# Patient Record
Sex: Male | Born: 2000 | Race: Black or African American | Hispanic: No | Marital: Single
Health system: Southern US, Community
[De-identification: ages and names within clinical notes are randomized; demographics above are authoritative.]

---

## 2020-09-18 ENCOUNTER — Other Ambulatory Visit: Payer: Self-pay

## 2020-09-18 ENCOUNTER — Emergency Department (HOSPITAL_COMMUNITY): Payer: Medicaid - Out of State

## 2020-09-18 ENCOUNTER — Emergency Department (HOSPITAL_COMMUNITY)
Admission: EM | Admit: 2020-09-18 | Discharge: 2020-09-18 | Disposition: A | Discharge status: Home / Self Care | Payer: Medicaid - Out of State | Attending: Emergency Medicine | Admitting: Emergency Medicine

## 2020-09-18 DIAGNOSIS — S6991XA Unspecified injury of right wrist, hand and finger(s), initial encounter: Secondary | ICD-10-CM | POA: Diagnosis present

## 2020-09-18 DIAGNOSIS — S60221A Contusion of right hand, initial encounter: Secondary | ICD-10-CM | POA: Insufficient documentation

## 2020-09-18 DIAGNOSIS — W25XXXA Contact with sharp glass, initial encounter: Secondary | ICD-10-CM | POA: Diagnosis not present

## 2020-09-18 NOTE — Discharge Instructions (Addendum)
X-rays did not reveal fracture dislocation  Take ibuprofen or acetaminophen every 6-8 hours for pain.  Ice.  Start doing light range of motion exercises with your finger/knuckles.  Pain and swelling should improve over the course of 7 to 10 days.  Follow-up with your primary care doctor in 1 to 2 weeks if pain and range of motion has not gone back to normal
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
## 2020-09-18 NOTE — ED Notes (Signed)
Unable to sign MSE as writing hand is injured.
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
## 2022-08-30 ENCOUNTER — Emergency Department (HOSPITAL_COMMUNITY)
Admission: EM | Admit: 2022-08-30 | Discharge: 2022-08-30 | Disposition: A | Discharge status: Home / Self Care | Payer: Medicaid - Out of State | Attending: Emergency Medicine | Admitting: Emergency Medicine

## 2022-08-30 ENCOUNTER — Encounter (HOSPITAL_COMMUNITY): Payer: Self-pay | Admitting: Registered Nurse

## 2022-08-30 DIAGNOSIS — F332 Major depressive disorder, recurrent severe without psychotic features: Secondary | ICD-10-CM | POA: Insufficient documentation

## 2022-08-30 DIAGNOSIS — R45851 Suicidal ideations: Secondary | ICD-10-CM | POA: Insufficient documentation

## 2022-08-30 DIAGNOSIS — X780XXA Intentional self-harm by sharp glass, initial encounter: Secondary | ICD-10-CM | POA: Insufficient documentation

## 2022-08-30 DIAGNOSIS — S0990XA Unspecified injury of head, initial encounter: Secondary | ICD-10-CM | POA: Diagnosis present

## 2022-08-30 DIAGNOSIS — S01112A Laceration without foreign body of left eyelid and periocular area, initial encounter: Secondary | ICD-10-CM | POA: Insufficient documentation

## 2022-08-30 DIAGNOSIS — F419 Anxiety disorder, unspecified: Secondary | ICD-10-CM | POA: Insufficient documentation

## 2022-08-30 DIAGNOSIS — F1010 Alcohol abuse, uncomplicated: Secondary | ICD-10-CM | POA: Diagnosis not present

## 2022-08-30 DIAGNOSIS — Y906 Blood alcohol level of 120-199 mg/100 ml: Secondary | ICD-10-CM | POA: Diagnosis not present

## 2022-08-30 DIAGNOSIS — Z7689 Persons encountering health services in other specified circumstances: Secondary | ICD-10-CM

## 2022-08-30 LAB — COMPREHENSIVE METABOLIC PANEL
ALT: 22 U/L (ref 0–44)
AST: 24 U/L (ref 15–41)
Albumin: 4.6 g/dL (ref 3.5–5.0)
Alkaline Phosphatase: 44 U/L (ref 38–126)
Anion gap: 12 (ref 5–15)
BUN: 12 mg/dL (ref 6–20)
CO2: 24 mmol/L (ref 22–32)
Calcium: 8.7 mg/dL — ABNORMAL LOW (ref 8.9–10.3)
Chloride: 105 mmol/L (ref 98–111)
Creatinine, Ser: 0.95 mg/dL (ref 0.61–1.24)
GFR, Estimated: >60 mL/min (ref >60)
Glucose, Bld: 88 mg/dL (ref 70–99)
Potassium: 3.6 mmol/L (ref 3.5–5.1)
Sodium: 141 mmol/L (ref 135–145)
Total Bilirubin: 0.8 mg/dL (ref 0.3–1.2)
Total Protein: 7.4 g/dL (ref 6.5–8.1)

## 2022-08-30 LAB — CBC
HCT: 47.1 % (ref 39.0–52.0)
Hemoglobin: 15.1 g/dL (ref 13.0–17.0)
MCH: 28.9 pg (ref 26.0–34.0)
MCHC: 32.1 g/dL (ref 30.0–36.0)
MCV: 90.2 fL (ref 80.0–100.0)
Platelets: 219 10*3/uL (ref 150–400)
RBC: 5.22 MIL/uL (ref 4.22–5.81)
RDW: 13.6 % (ref 11.5–15.5)
WBC: 8.1 10*3/uL (ref 4.0–10.5)
nRBC: 0 % (ref 0.0–0.2)

## 2022-08-30 LAB — ETHANOL: Alcohol, Ethyl (B): 180 mg/dL — ABNORMAL HIGH (ref <10)

## 2022-08-30 LAB — ACETAMINOPHEN LEVEL: Acetaminophen (Tylenol), Serum: <10 ug/mL — ABNORMAL LOW (ref 10–30)

## 2022-08-30 LAB — SALICYLATE LEVEL: Salicylate Lvl: <7 mg/dL — ABNORMAL LOW (ref 7.0–30.0)
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
## 2022-08-30 NOTE — ED Notes (Signed)
Patient refusing all care at this time saying he doesn't want the bill after being forced to come here

## 2022-08-30 NOTE — ED Notes (Signed)
Pt has 1 belongings bag places in cabinets behind 9-12 nurses station

## 2022-08-30 NOTE — ED Provider Notes (Cosign Needed Addendum)
  At this time patient is clinically sober, has been reassessed by myself.  Some dried blood noted on vertex of scalp but no deep laceration requiring surgical repair.  Patient is resting comfortably.  Will allow TTS for further management of his psychiatric illness.  2:58 PM Psychiatry specialist has seen evaluate patient and at this time he is psychiatrically cleared.  IVC paper will be rescind by attending provider.   BP (!) 100/50 (BP Location: Right Arm)   Pulse 62   Temp 97.7 F (36.5 C) (Oral)   Resp 16   SpO2 99%   Results for orders placed or performed during the hospital encounter of 08/30/22  CBC  Result Value Ref Range   WBC 8.1 4.0 - 10.5 K/uL   RBC 5.22 4.22 - 5.81 MIL/uL   Hemoglobin 15.1 13.0 - 17.0 g/dL   HCT 47.1 39.0 - 52.0 %   MCV 90.2 80.0 - 100.0 fL   MCH 28.9 26.0 - 34.0 pg   MCHC 32.1 30.0 - 36.0 g/dL   RDW 13.6 11.5 - 15.5 %   Platelets 219 150 - 400 K/uL   nRBC 0.0 0.0 - 0.2 %  Comprehensive metabolic panel  Result Value Ref Range   Sodium 141 135 - 145 mmol/L   Potassium 3.6 3.5 - 5.1 mmol/L   Chloride 105 98 - 111 mmol/L   CO2 24 22 - 32 mmol/L   Glucose, Bld 88 70 - 99 mg/dL   BUN 12 6 - 20 mg/dL   Creatinine, Ser 0.95 0.61 - 1.24 mg/dL   Calcium 8.7 (L) 8.9 - 10.3 mg/dL   Total Protein 7.4 6.5 - 8.1 g/dL   Albumin 4.6 3.5 - 5.0 g/dL   AST 24 15 - 41 U/L   ALT 22 0 - 44 U/L   Alkaline Phosphatase 44 38 - 126 U/L   Total Bilirubin 0.8 0.3 - 1.2 mg/dL   GFR, Estimated >60 >60 mL/min   Anion gap 12 5 - 15  Ethanol  Result Value Ref Range   Alcohol, Ethyl (B) 180 (H) Q000111Q mg/dL  Salicylate level  Result Value Ref Range   Salicylate Lvl Q000111Q (L) 7.0 - 30.0 mg/dL  Acetaminophen level  Result Value Ref Range   Acetaminophen (Tylenol), Serum <10 (L) 10 - 30 ug/mL   No results found.    Domenic Moras, PA-C 08/30/22 1432    Domenic Moras, PA-C 08/30/22 1531    Cristie Hem, MD 08/31/22 1357

## 2022-08-30 NOTE — ED Notes (Signed)
Pt wheeled to [facility] at this time. Asleep in his chair. Pt agreeable to standing and letting security wand him. Pt placed in bed and given covers. Pt requesting to sleep at this time. States he had tequila shots and beer last night. Currently denying SI/HI, no AVH.

## 2022-08-30 NOTE — ED Notes (Signed)
Patient to room 37. Patient ambulated to room Patient drowsy but alert. Patient cooperative. Patient oriented to unit and room Patient denied suicidal ideation at this time.

## 2022-08-30 NOTE — ED Notes (Signed)
Patient discharged off unit per provider. Patient alert and cooperative. discharge information given to patient with acknowledged understanding. Belongings given to patient,. Patient ambulatory off unit, escorted byRN. Patient transported by family
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
## 2022-08-30 NOTE — Discharge Instructions (Signed)
Same-day appointments Operating hours and clinician availability may delay appointments until the next business day. [facility] and Current Patients  Psychiatry hours Monday-Friday, 8am-4:30pm (Eastern Time) If you are experiencing problems accessing [facility] send email to: [email or web address] or call [phone], Monday-Friday, 8:00am-4:30pm If you are having a psychiatric or medical emergency, please call 911 or go to the nearest emergency department. To reach the Suicide and Crisis Lifeline, please call or text [phone].  What is [facility]?  [facility] is an online service that provides same-day access to psychiatry to meet urgent mental health needs. The goal is to provide patients with timely intervention and keep them in an outpatient setting.  How long will I wait to see a clinician?  Our goal is to provide same-day care. However, operating hours and clinician availability may delay appointments until the next business day.  What if I can't get an appointment?  Please call [facility] at [phone] 8am to [place] Time or email [place]:  [email or web address]  to request the next available time. If you are having a psychiatric or medical emergency, please call 911 or go to the nearest emergency department. To reach the Suicide and Crisis Lifeline, please call or text [phone]. Do you accept insurance?  Yes. We accept most commercial insurance plans. What information do you need from me? New patients should be ready to provide:  Photo ID  Insurance card  Payment information  For current patients, our specialists will confirm your documentation is on file.   Can I continue with regular care after my [facility] session?  Yes. Our goal is to make sure you have the follow-up care you need. Our specialist can help you schedule an appointment with an ongoing provider in addition to your On Demand session.  Can my current clinician provide  treatment on [place]?  No. Our [facility] clinicians are trained to assist with more immediate needs and provide support between regular appointments with your clinician.  What device can I use to connect with [facility]?  You can use any Wi-Fi-enabled device with a camera and a microphone, such as a smartphone, tablet, or computer.  Do I have to be at home to connect with [place] [facility]?  No. As long as you are located within [place] or [place] you can connect with a provider. We do request that you connect from a safe and private location. Your clinician is required to document your location for emergency purposes.         M.E.N.D. meets ONLINE via ZOOM from 12pm-1pm on:  REGISTRATION REQUIRED:  [email or web address] February 7th  March 6th May 1st June 5th August 7th September 4th November 6th  MEND: Men's Building surveyor for [employer] to [employer], the [employer] for [employer], a Midwife (RGN) initiative from the [employer].  MEND is a community where men come together to support, learn, and grow, emphasizing key aspects of personal and community development.  MEND's Mission: MEND is committed to fostering resilience, empowerment, awareness, and community health among men. Through open dialogue and shared experiences, we strive to create a supportive space where men can navigate challenges, enhance their well-being, and contribute positively to the community.  Join [place]: MEND invites men from all walks of life to join our community. Whether you're a father, a professional, or someone navigating life's complexities, MEND is here for you. Together, we can strengthen the fabric  of our community and empower each other to lead fulfilling lives. Explore the MEND community and take the first step towards a journey of personal growth and community impact. Topics of  Conversation Fatherhood: Explore the joys and challenges of fatherhood, sharing insights and experiences. Mental Health: Address the importance of mental health, reduce stigma, and provide support. Substance Use: Foster understanding and promote dialogue around substance use and its impact. Healthcare: Discuss health-related topics, preventive care, and overall well-being. Navigating Community Systems: Share strategies for navigating community resources and systems effectively.  What We Offer: Regular Meetings: Engage in meaningful discussions on fatherhood, mental health, substance use, healthcare, and navigating community systems. Supportive Environment: Find a welcoming space where your voice is heard, and your journey is valued.

## 2022-08-30 NOTE — BH Assessment (Signed)
Per RN, Pt is currently too somnolent to participate in tele-assessment.   Evin Chirco Ellis Amdrew Oboyle Jr, LCMHC, NCC Triage Specialist [phone]  

## 2022-08-30 NOTE — Consult Note (Signed)
[facility] ED ASSESSMENT   Reason for Consult:  suicidal ideation Referring Physician:  Azucena Cecil, PA-C  Patient Identification: Andrew Prince MRN:  [id number] ED Chief Complaint: MDD (major depressive disorder), recurrent severe, without psychosis ([place])  Diagnosis:  Principal Problem:   MDD (major depressive disorder), recurrent severe, without psychosis ([place]) Active Problems:   Suicidal ideation   ED Assessment Time Calculation: Start Time: 1030 Stop Time: 1100 Total Time in Minutes (Assessment Completion): 30   Subjective:   Andrew Prince is a 22 y.o. male patient admitted to [facility] ED after presenting via EMS with complaints of intoxication, hitting himself in the head with a lamp, and suicidal ideation  HPI:  Andrew Prince 22 y.o., male patient seen face to face by this provider, consulted with Dr. Hampton Abbot; and chart reviewed on 08/30/22.  On evaluation Andrew Prince reports Andrew Prince was intoxicated but Andrew Prince did hit himself in the head.  Andrew Prince states that it was not a suicide attempt and denies suicidal ideation.  Andrew Prince reports a history of depression and anxiety.  States Andrew Prince has outpatient psychiatric services for medication management and counseling.  Stopped Lexapro about a month ago and psychiatrist is aware.  Stating that Andrew Prince doesn't want to be restarted on antidepressant that Andrew Prince is currently taking medication for anxiety and working well.  States that Andrew Prince would like a new therapist related to difficulty contacting current therapist.  States Andrew Prince is supposed to have weekly sessions but only getting monthly.  Patient denies suicidal/self-harm/homicidal ideation, psychosis, and paranoia.  No prior suicide attempt of self harm.  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  Gave permission to speak to girlfriend for collateral information. During evaluation Andrew Prince is lying in bed with no noted distress.  Andrew Prince is alert/oriented x 4, calm, cooperative, and attentive.  His  responses were appropriate to assessment questions.  His mood is anxious with congruent affect.  Andrew Prince spoke in a clear tone at moderate volume, and normal pace, with good eye contact.   Andrew Prince denies suicidal/self-harm/homicidal ideation, psychosis, and paranoia.  Objectively:  there is no evidence of psychosis/mania or delusional thinking.  Andrew Prince conversed coherently, with goal directed thoughts, and no distractibility, or pre-occupation and Andrew Prince has denied suicidal/self-harm/homicidal ideation, psychosis, and paranoia.  Collateral Information gathered from girlfriend who states that she doesn't feel that patient is a danger to himself or other.  Did suggest patient needed another therapist.  Resources added to discharge instructions.      At this time Andrew Prince is educated and verbalizes understanding of mental health resources and other crisis services in the community. Andrew Prince is instructed to call 911 and present to the nearest emergency room should Andrew Prince experience any suicidal/homicidal ideation, auditory/visual/hallucinations, or detrimental worsening of his mental health condition.   Andrew Prince was a also advised by Probation officer that Andrew Prince could call the toll-free phone on back of  insurance card to assist with identifying in network services and agencies.    Past Psychiatric History: Anxiety, depression, alcohol use mild  Risk to Self or Others: Is the patient at risk to self? No Has the patient been a risk to self in the past 6 months? No Has the patient been a risk to self within the distant past? No Is the patient a risk to others? No Has the patient been a risk to others in the past 6 months? No Has the patient been a risk to others within the distant past? No  [place] Scale:  [facility] from 08/30/2022 in [facility] Emergency Department at [facility] ED from 09/18/2020 in [facility] Emergency Department at [place] No Risk No Risk       AIMS:  , , ,  ,    ASAM:    Substance Abuse:     Past Medical History: History reviewed. No pertinent past medical history. History reviewed. No pertinent surgical history. Family History: History reviewed. No pertinent family history. Family Psychiatric  History: History reviewed. No pertinent family history.   None reported Social History:  Social History   Substance and Sexual Activity  Alcohol Use None     Social History   Substance and Sexual Activity  Drug Use Not on file    Social History   Socioeconomic History   Marital status: Single    Spouse name: Not on file   Number of children: Not on file   Years of education: Not on file   Highest education level: Not on file  Occupational History   Not on file  Tobacco Use   Smoking status: Not on file   Smokeless tobacco: Not on file  Substance and Sexual Activity   Alcohol use: Not on file   Drug use: Not on file   Sexual activity: Not on file  Other Topics Concern   Not on file  Social History Narrative   Not on file   Social Determinants of Health   Financial Resource Strain: Not on file  Food Insecurity: Not on file  Transportation Needs: Not on file  Physical Activity: Not on file  Stress: Not on file  Social Connections: Not on file   Additional Social History:    Allergies:  No Known Allergies  Labs:  Results for orders placed or performed during the hospital encounter of 08/30/22 (from the past 48 hour(s))  CBC     Status: None   Collection Time: 08/30/22  3:41 AM  Result Value Ref Range   WBC 8.1 4.0 - 10.5 K/uL   RBC 5.22 4.22 - 5.81 MIL/uL   Hemoglobin 15.1 13.0 - 17.0 g/dL   HCT 47.1 39.0 - 52.0 %   MCV 90.2 80.0 - 100.0 fL   MCH 28.9 26.0 - 34.0 pg   MCHC 32.1 30.0 - 36.0 g/dL   RDW 13.6 11.5 - 15.5 %   Platelets 219 150 - 400 K/uL   nRBC 0.0 0.0 - 0.2 %    Comment: Performed at [facility], [place] [address], [place], [place] [address]  Comprehensive metabolic panel     Status:  Abnormal   Collection Time: 08/30/22  3:41 AM  Result Value Ref Range   Sodium 141 135 - 145 mmol/L   Potassium 3.6 3.5 - 5.1 mmol/L   Chloride 105 98 - 111 mmol/L   CO2 24 22 - 32 mmol/L   Glucose, Bld 88 70 - 99 mg/dL    Comment: Glucose reference range applies only to samples taken after fasting for at least 8 hours.   BUN 12 6 - 20 mg/dL   Creatinine, Ser 0.95 0.61 - 1.24 mg/dL   Calcium 8.7 (L) 8.9 - 10.3 mg/dL   Total Protein 7.4 6.5 - 8.1 g/dL   Albumin 4.6 3.5 - 5.0 g/dL   AST 24 15 - 41 U/L   ALT 22 0 - 44 U/L   Alkaline Phosphatase 44 38 - 126 U/L   Total Bilirubin 0.8  0.3 - 1.2 mg/dL   GFR, Estimated >60 >60 mL/min    Comment: (NOTE) Calculated using the CKD-EPI Creatinine Equation (2021)    Anion gap 12 5 - 15    Comment: Performed at [facility], [place] [address], [place], [place] [address]  Ethanol     Status: Abnormal   Collection Time: 08/30/22  3:41 AM  Result Value Ref Range   Alcohol, Ethyl (B) 180 (H) <10 mg/dL    Comment: (NOTE) Lowest detectable limit for serum alcohol is 10 mg/dL.  For medical purposes only. Performed at [facility], [place] [address], [place], [place] [id number]   Salicylate level     Status: Abnormal   Collection Time: 08/30/22  3:41 AM  Result Value Ref Range   Salicylate Lvl Q000111Q (L) 7.0 - 30.0 mg/dL    Comment: Performed at [facility], [place] [address] [place], [place] [address]  Acetaminophen level     Status: Abnormal   Collection Time: 08/30/22  3:41 AM  Result Value Ref Range   Acetaminophen (Tylenol), Serum <10 (L) 10 - 30 ug/mL    Comment: (NOTE) Therapeutic concentrations vary significantly. A range of 10-30 ug/mL  may be an effective concentration for many patients. However, some  are best treated at concentrations outside of this range. Acetaminophen concentrations >150 ug/mL at 4 hours after ingestion  and >50 ug/mL at 12 hours after ingestion are often  associated with  toxic reactions.  Performed at [facility], [place] [address] [place], [place] [address]     No current facility-administered medications for this encounter.   No current outpatient medications on file.    Musculoskeletal: Strength & Muscle Tone: within normal limits Gait & Station: normal Patient leans: N/A   Psychiatric Specialty Exam: Presentation  General Appearance:  Appropriate for Environment  Eye Contact: Good  Speech: Clear and Coherent; Normal Rate  Speech Volume: Normal  Handedness: Right   Mood and Affect  Mood: Anxious; Dysphoric  Affect: Congruent   Thought Process  Thought Processes: Coherent; Goal Directed  Descriptions of Associations:Intact  Orientation:No data recorded Thought Content:Logical  History of Schizophrenia/Schizoaffective disorder:No data recorded Duration of Psychotic Symptoms:No data recorded Hallucinations:Hallucinations: None  Ideas of Reference:None  Suicidal Thoughts:Suicidal Thoughts: No  Homicidal Thoughts:Homicidal Thoughts: No   Sensorium  Memory: Immediate Good; Recent Good; Remote Good  Judgment: Intact  Insight: Present   Executive Functions  Concentration: Good  Attention Span: Good  Recall: Good  Fund of Knowledge: Good  Language: Good   Psychomotor Activity  Psychomotor Activity: Psychomotor Activity: Normal   Assets  Assets: Communication Skills; Desire for Improvement; Financial Resources/Insurance; Housing; Social Support; Resilience; Physical Health; Leisure Time; Intimacy; Transportation    Sleep  Sleep: Sleep: Good   Physical Exam: Physical Exam Vitals and nursing note reviewed. Exam conducted with a chaperone present.  Constitutional:      General: Andrew Prince is not in acute distress.    Appearance: Normal appearance. Andrew Prince is not ill-appearing.  HENT:     Head: Normocephalic.  Cardiovascular:     Rate and Rhythm: Normal  rate.  Pulmonary:     Effort: Pulmonary effort is normal.  Musculoskeletal:        General: Normal range of motion.  Skin:    General: Skin is warm and dry.     Comments: Superficial laceration linear to left eyelid scabbed over, no signs or symptoms of infection, no complaints about vision  Neurological:  Mental Status: Andrew Prince is alert and oriented to person, place, and time.  Psychiatric:        Attention and Perception: Attention and perception normal. Andrew Prince does not perceive auditory or visual hallucinations.        Mood and Affect: Affect normal. Mood is anxious. Depressed: dysphoric.       Speech: Speech normal.        Behavior: Behavior normal. Behavior is cooperative.        Thought Content: Thought content is not paranoid or delusional. Thought content does not include homicidal or suicidal ideation.        Cognition and Memory: Cognition normal.        Judgment: Judgment normal.    Review of Systems  Skin:        Superficial laceration linear to left eyelid Reports Andrew Prince hit himself with lamp.  Psychiatric/Behavioral:  Negative for hallucinations. Depression: Stable. Substance abuse: Alcohol. Suicidal ideas: Denies.The patient is nervous/anxious.        Reports Andrew Prince has outpatient psychiatric services but would like another counselor related to unable to get in touch with current counselor.  States suppose to have sessions once a week but only getting once a month.    All other systems reviewed and are negative.  Blood pressure 105/65, pulse 64, temperature 98.6 F (37 C), temperature source Oral, resp. rate 18, SpO2 99 %. There is no height or weight on file to calculate BMI.  Medical Decision Making: Psychiatrically cleared  Resources given for outpatient psychiatric services  Disposition: No evidence of imminent risk to self or others at present.   Patient does not meet criteria for psychiatric inpatient admission. Supportive therapy provided about ongoing  stressors. Discussed crisis plan, support from social network, calling 911, coming to the Emergency Department, and calling Suicide Hotline.  Traylen Eckels, NP 08/30/2022 3:07 PM
